# Patient Record
Sex: Female | Born: 1985 | Race: Black or African American | Hispanic: No | Marital: Single | Smoking: Never smoker
Health system: Southern US, Community
[De-identification: ages and names within clinical notes are randomized; demographics above are authoritative.]

---
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
## 2007-02-19 ENCOUNTER — Emergency Department: Payer: Self-pay | Admitting: Emergency Medicine

## 2008-04-19 ENCOUNTER — Emergency Department: Payer: Self-pay | Admitting: Emergency Medicine

## 2008-06-24 ENCOUNTER — Emergency Department: Payer: Self-pay | Admitting: Unknown Physician Specialty

## 2008-08-25 ENCOUNTER — Emergency Department: Payer: Self-pay | Admitting: Emergency Medicine

## 2008-08-27 ENCOUNTER — Emergency Department: Payer: Self-pay | Admitting: Emergency Medicine

## 2008-10-08 ENCOUNTER — Emergency Department: Payer: Self-pay | Admitting: Emergency Medicine

## 2008-11-18 ENCOUNTER — Observation Stay: Payer: Self-pay | Admitting: Obstetrics and Gynecology

## 2009-03-01 ENCOUNTER — Observation Stay: Payer: Self-pay | Admitting: Obstetrics & Gynecology

## 2009-04-06 ENCOUNTER — Inpatient Hospital Stay: Payer: Self-pay

## 2009-07-11 ENCOUNTER — Emergency Department: Payer: Self-pay | Admitting: Emergency Medicine

## 2010-07-14 ENCOUNTER — Ambulatory Visit: Payer: Self-pay | Admitting: Internal Medicine

## 2011-01-24 ENCOUNTER — Emergency Department: Payer: Self-pay | Admitting: Emergency Medicine

## 2011-07-25 ENCOUNTER — Observation Stay: Payer: Self-pay | Admitting: Obstetrics and Gynecology

## 2011-08-07 ENCOUNTER — Inpatient Hospital Stay: Payer: Self-pay | Admitting: Obstetrics and Gynecology

## 2011-08-07 LAB — CBC WITH DIFFERENTIAL/PLATELET
Basophil #: 0 10*3/uL (ref 0.0–0.1)
Basophil %: 0.2 %
MCH: 28.1 pg (ref 26.0–34.0)
MCHC: 31.9 g/dL — ABNORMAL LOW (ref 32.0–36.0)
Monocyte #: 1.1 x10 3/mm — ABNORMAL HIGH (ref 0.2–0.9)
Monocyte %: 6.8 %
Neutrophil #: 12.8 10*3/uL — ABNORMAL HIGH (ref 1.4–6.5)
Neutrophil %: 81.2 %
Platelet: 283 10*3/uL (ref 150–440)
RDW: 15.4 % — ABNORMAL HIGH (ref 11.5–14.5)

## 2011-08-08 LAB — HEMATOCRIT: HCT: 31.3 % — ABNORMAL LOW (ref 35.0–47.0)

## 2012-06-09 ENCOUNTER — Emergency Department: Payer: Self-pay | Admitting: Internal Medicine

## 2012-06-09 LAB — URINALYSIS, COMPLETE
Bilirubin,UR: NEGATIVE
Blood: NEGATIVE
Nitrite: NEGATIVE
Ph: 6 (ref 4.5–8.0)
RBC,UR: 3 /HPF (ref 0–5)
Specific Gravity: 1.029 (ref 1.003–1.030)
Squamous Epithelial: 12

## 2012-06-09 LAB — CBC
HCT: 37.5 % (ref 35.0–47.0)
HGB: 12.6 g/dL (ref 12.0–16.0)
MCHC: 33.7 g/dL (ref 32.0–36.0)
MCV: 94 fL (ref 80–100)
RBC: 3.97 10*6/uL (ref 3.80–5.20)
RDW: 13.5 % (ref 11.5–14.5)
WBC: 10.3 10*3/uL (ref 3.6–11.0)

## 2012-06-09 LAB — COMPREHENSIVE METABOLIC PANEL
Albumin: 4.1 g/dL (ref 3.4–5.0)
Alkaline Phosphatase: 70 U/L (ref 50–136)
BUN: 12 mg/dL (ref 7–18)
Bilirubin,Total: 0.3 mg/dL (ref 0.2–1.0)
Co2: 27 mmol/L (ref 21–32)
Creatinine: 0.75 mg/dL (ref 0.60–1.30)
EGFR (Non-African Amer.): >60
Osmolality: 276 (ref 275–301)
Potassium: 3.5 mmol/L (ref 3.5–5.1)
SGOT(AST): 27 U/L (ref 15–37)
Total Protein: 8 g/dL (ref 6.4–8.2)

## 2012-06-09 LAB — LIPASE, BLOOD: Lipase: 142 U/L (ref 73–393)

## 2012-08-15 ENCOUNTER — Other Ambulatory Visit: Payer: Self-pay | Admitting: Gastroenterology

## 2012-08-15 LAB — HCG, QUANTITATIVE, PREGNANCY: Beta Hcg, Quant.: <1 m[IU]/mL — ABNORMAL LOW

## 2012-08-18 ENCOUNTER — Ambulatory Visit: Payer: Self-pay | Admitting: Gastroenterology

## 2012-09-03 ENCOUNTER — Emergency Department: Payer: Self-pay | Admitting: Emergency Medicine

## 2013-08-13 IMAGING — NM NUCLEAR MEDICINE HEPATOHBILIARY INCLUDE GB
3 series · 20 of 20 positions shown · non-contrast
Comparison: none

REASON FOR EXAM: abd epigastric abnormal weight loss nausea w vomiting
change in bowel habits...
COMMENTS:

[Series 1000: gallbladder statics · 4.80mm/px · 8 of 8 slices shown]
[im 1/8]
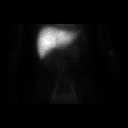
[im 2/8]
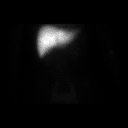
[im 3/8]
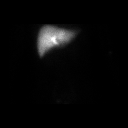
[im 4/8]
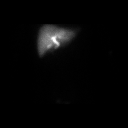
[im 5/8]
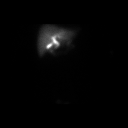
[im 6/8]
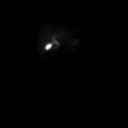
[im 7/8]
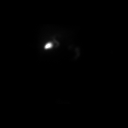
[im 8/8]
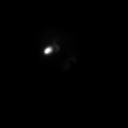

[Series 1000: gallbladder dynamic · 4.80mm/px · 6 of 60 frames shown]
[frame 6/60]
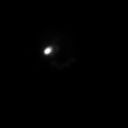
[frame 16/60]
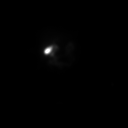
[frame 26/60]
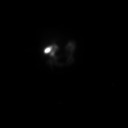
[frame 36/60]
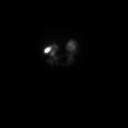
[frame 46/60]
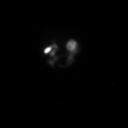
[frame 56/60]
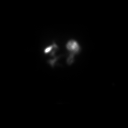

[Series 1000: gallbladder dynamic (results) · 4.80mm/px · 6 of 60 frames shown]
[frame 6/60]
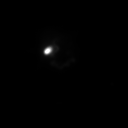
[frame 16/60]
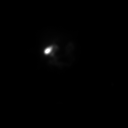
[frame 26/60]
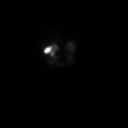
[frame 36/60]
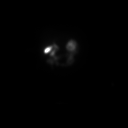
[frame 46/60]
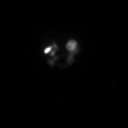
[frame 56/60]
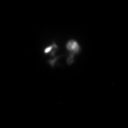

[20 of 20 positions shown; findings below may reference images not displayed]

PROCEDURE:     KNM - KNM HEPATO W/GB EJECT FRACTION  - August 18, 2012  [DATE]

RESULT:     Patient received an injection of 8.23 mCi of technetium 99 M
Choletec. Images demonstrate prompt extraction of tracer from the blood pool
by the liver with common bile duct activity seen by 10 minutes and early
gallbladder activity seen by 15 minutes. Small bowel activity is present at
20 minutes. There is continued clearing of activity from the liver
accumulation in the gallbladder throughout the study. The patient received a
continuous infusion of sincalide with a total dose of 1.46 mcg administered
intravenously over 30 minutes. The gallbladder ejection fraction is measured
at 65%.
IMPRESSION: Normal appearing hepatobiliary scan. Normal gallbladder
ejection fraction is 65% in response to sincalide infusion.

[REDACTED]

## 2014-04-13 NOTE — L&D Delivery Note (Signed)
Delivery Note At 1:52 AM a viable female was delivered via Vaginal, Spontaneous Delivery (Presentation: ;  ).  APGAR8/9: , ; weight  . Not done yet   Placenta status:intact  , .  Cord:delayed clamping   with the following complications None :  Anesthesia: Epidural  Episiotomy:  none Lacerations:  Second degree Suture Repair: 2.0 3.0 vicryl Est. Blood Loss (mL):    Mom to postpartum.  Baby to Couplet care / Skin to Skin.  Chelsea Wolfe 03/08/2015, 2:10 AM

## 2014-08-21 NOTE — H&P (Signed)
L&D Evaluation:  History:   HPI 29 yo G2P1001 at 3250w3d gestational age, pregnancy uncomplicated, presents for contractions.  Denies vaginal bleeding and leakage of fluid. Notes positive fetal movement.  O+, RI, RPR NR, HBsAg neg, VZI, GBS neg    Presents with contractions    Patient's Medical History No Chronic Illness    Patient's Surgical History none    Medications Pre Natal Vitamins    Allergies hydrocodone = rash    Social History tobacco    Family History Non-Contributory   ROS:   ROS negative unless noted in HPI   Exam:   Vital Signs stable    General having painful contractions    Mental Status clear    Chest clear    Heart normal sinus rhythm    Abdomen gravid, tender with contractions    Estimated Fetal Weight Average for gestational age    Fetal Position cephalic    Back no CVAT    Edema no edema    Pelvic 7cm per RN    Mebranes Intact    FHT normal rate with no decels    FHT Description 130/mod var/+accels/ no decels    Ucx irregular, 3-4 q 10 min    Skin no lesions   Impression:   Impression active labor   Plan:   Plan EFM/NST, monitor contractions and for cervical change, fluids    Comments CBC/T&S IVF, NPO CEFM/TOCO notify anesthesia ASAP. No IV analgesics as she may be progressing quickly.  If she slows down or stalls, may consider   Electronic Signatures: Conard NovakJackson, Oakley Kossman D (MD)  (Signed 26-Apr-13 18:06)  Authored: L&D Evaluation   Last Updated: 26-Apr-13 18:06 by Conard NovakJackson, Keiarah Orlowski D (MD)

## 2014-09-25 LAB — OB RESULTS CONSOLE HIV ANTIBODY (ROUTINE TESTING)
HIV: NONREACTIVE
HIV: NONREACTIVE

## 2014-09-25 LAB — OB RESULTS CONSOLE GC/CHLAMYDIA
Chlamydia: NEGATIVE
GC PROBE AMP, GENITAL: NEGATIVE

## 2014-09-25 LAB — OB RESULTS CONSOLE HEPATITIS B SURFACE ANTIGEN: Hepatitis B Surface Ag: NEGATIVE

## 2014-09-25 LAB — OB RESULTS CONSOLE RUBELLA ANTIBODY, IGM: Rubella: IMMUNE

## 2014-09-25 LAB — OB RESULTS CONSOLE RPR
RPR: NONREACTIVE
RPR: NONREACTIVE

## 2015-02-07 LAB — OB RESULTS CONSOLE GBS
STREP GROUP B AG: NEGATIVE
STREP GROUP B AG: NEGATIVE
STREP GROUP B AG: NEGATIVE
STREP GROUP B AG: NEGATIVE

## 2015-03-07 ENCOUNTER — Inpatient Hospital Stay: Payer: Self-pay | Admitting: Anesthesiology

## 2015-03-07 ENCOUNTER — Inpatient Hospital Stay
Admission: EM | Admit: 2015-03-07 | Discharge: 2015-03-09 | DRG: 775 | Disposition: A | Discharge status: Home / Self Care | Payer: Self-pay | Attending: Obstetrics and Gynecology | Admitting: Obstetrics and Gynecology

## 2015-03-07 ENCOUNTER — Encounter: Payer: Self-pay | Admitting: *Deleted

## 2015-03-07 DIAGNOSIS — Z3A39 39 weeks gestation of pregnancy: Secondary | ICD-10-CM

## 2015-03-07 LAB — CBC
HEMATOCRIT: 36.8 % (ref 35.0–47.0)
HEMOGLOBIN: 12.7 g/dL (ref 12.0–16.0)
MCH: 32.2 pg (ref 26.0–34.0)
MCHC: 34.4 g/dL (ref 32.0–36.0)
MCV: 93.7 fL (ref 80.0–100.0)
Platelets: 200 10*3/uL (ref 150–440)
RBC: 3.93 MIL/uL (ref 3.80–5.20)
RDW: 13.6 % (ref 11.5–14.5)
WBC: 11.4 10*3/uL — AB (ref 3.6–11.0)

## 2015-03-07 LAB — TYPE AND SCREEN
ABO/RH(D): O POS
ANTIBODY SCREEN: NEGATIVE

## 2015-03-07 LAB — ABO/RH: ABO/RH(D): O POS

## 2015-03-07 MED ORDER — NALOXONE HCL 0.4 MG/ML IJ SOLN: 0.4000 mg | INTRAMUSCULAR | Status: DC | PRN

## 2015-03-07 MED ORDER — LACTATED RINGERS IV SOLN
INTRAVENOUS | Status: DC
  Administered 2015-03-07: 1000 mL via INTRAVENOUS

## 2015-03-07 MED ORDER — LACTATED RINGERS IV SOLN
500.0000 mL | INTRAVENOUS | Status: DC | PRN
  Administered 2015-03-07: 500 mL via INTRAVENOUS

## 2015-03-07 MED ORDER — KETOROLAC TROMETHAMINE 30 MG/ML IJ SOLN: 30.0000 mg | Freq: Four times a day (QID) | INTRAMUSCULAR | Status: AC | PRN

## 2015-03-07 MED ORDER — SODIUM CHLORIDE 0.9 % IJ SOLN: 3.0000 mL | INTRAMUSCULAR | Status: DC | PRN

## 2015-03-07 MED ORDER — MEPERIDINE HCL 25 MG/ML IJ SOLN: 6.2500 mg | INTRAMUSCULAR | Status: DC | PRN

## 2015-03-07 MED ORDER — NALBUPHINE HCL 10 MG/ML IJ SOLN
5.0000 mg | Freq: Once | INTRAMUSCULAR | Status: DC | PRN
  Filled 2015-03-07: qty 0.5

## 2015-03-07 MED ORDER — CITRIC ACID-SODIUM CITRATE 334-500 MG/5ML PO SOLN: 30.0000 mL | ORAL | Status: DC | PRN

## 2015-03-07 MED ORDER — BUTORPHANOL TARTRATE 1 MG/ML IJ SOLN
INTRAMUSCULAR | Status: AC
  Administered 2015-03-07: 1 mg
  Filled 2015-03-07: qty 1

## 2015-03-07 MED ORDER — PENICILLIN G POTASSIUM 5000000 UNITS IJ SOLR
2.5000 10*6.[IU] | INTRAVENOUS | Status: DC
  Filled 2015-03-07 (×4): qty 2.5

## 2015-03-07 MED ORDER — SODIUM CHLORIDE 0.9 % IV SOLN
INTRAVENOUS | Status: AC | PRN
  Administered 2015-03-07 (×2): 5 mL via EPIDURAL

## 2015-03-07 MED ORDER — DIPHENHYDRAMINE HCL 25 MG PO CAPS: 25.0000 mg | ORAL_CAPSULE | ORAL | Status: DC | PRN

## 2015-03-07 MED ORDER — SODIUM CHLORIDE 0.9 % IJ SOLN
INTRAMUSCULAR | Status: AC
  Filled 2015-03-07: qty 3

## 2015-03-07 MED ORDER — NALBUPHINE HCL 10 MG/ML IJ SOLN
5.0000 mg | INTRAMUSCULAR | Status: DC | PRN
  Filled 2015-03-07: qty 0.5

## 2015-03-07 MED ORDER — ONDANSETRON HCL 4 MG/2ML IJ SOLN
4.0000 mg | Freq: Three times a day (TID) | INTRAMUSCULAR | Status: DC | PRN
Start: 2015-03-07 — End: 2015-03-09

## 2015-03-07 MED ORDER — ONDANSETRON HCL 4 MG/2ML IJ SOLN: 4.0000 mg | Freq: Four times a day (QID) | INTRAMUSCULAR | Status: DC | PRN

## 2015-03-07 MED ORDER — OXYTOCIN BOLUS FROM INFUSION: 500.0000 mL | INTRAVENOUS | Status: DC

## 2015-03-07 MED ORDER — LIDOCAINE-EPINEPHRINE (PF) 1.5 %-1:200000 IJ SOLN
INTRAMUSCULAR | Status: AC | PRN
  Administered 2015-03-07: 3 mL via PERINEURAL

## 2015-03-07 MED ORDER — NALOXONE HCL 2 MG/2ML IJ SOSY
1.0000 ug/kg/h | PREFILLED_SYRINGE | INTRAVENOUS | Status: DC | PRN
  Filled 2015-03-07: qty 2

## 2015-03-07 MED ORDER — LIDOCAINE HCL (PF) 1 % IJ SOLN
30.0000 mL | INTRAMUSCULAR | Status: DC | PRN
  Filled 2015-03-07: qty 30

## 2015-03-07 MED ORDER — SODIUM CHLORIDE 0.9 % IJ SOLN
INTRAMUSCULAR | Status: AC
  Filled 2015-03-07: qty 20

## 2015-03-07 MED ORDER — OXYTOCIN 40 UNITS IN LACTATED RINGERS INFUSION - SIMPLE MED
62.5000 mL/h | INTRAVENOUS | Status: DC
  Filled 2015-03-07: qty 1000

## 2015-03-07 MED ORDER — FENTANYL 2.5 MCG/ML W/ROPIVACAINE 0.2% IN NS 100 ML EPIDURAL INFUSION (ARMC-ANES): 10.0000 mL/h | EPIDURAL | Status: DC

## 2015-03-07 MED ORDER — ACETAMINOPHEN 325 MG PO TABS
650.0000 mg | ORAL_TABLET | ORAL | Status: DC | PRN
  Filled 2015-03-07 (×2): qty 2

## 2015-03-07 MED ORDER — FENTANYL 2.5 MCG/ML W/ROPIVACAINE 0.2% IN NS 100 ML EPIDURAL INFUSION (ARMC-ANES)
EPIDURAL | Status: AC
  Administered 2015-03-07: 10 mL/h via EPIDURAL
  Filled 2015-03-07: qty 100

## 2015-03-07 MED ORDER — PENICILLIN G POTASSIUM 5000000 UNITS IJ SOLR
5.0000 10*6.[IU] | Freq: Once | INTRAVENOUS | Status: DC
  Filled 2015-03-07: qty 5

## 2015-03-07 MED ORDER — DIPHENHYDRAMINE HCL 50 MG/ML IJ SOLN: 12.5000 mg | INTRAMUSCULAR | Status: DC | PRN

## 2015-03-07 MED ORDER — LIDOCAINE HCL (PF) 1 % IJ SOLN: INTRAMUSCULAR | Status: DC | PRN

## 2015-03-07 NOTE — Anesthesia Preprocedure Evaluation (Signed)
Anesthesia Evaluation  Patient identified by MRN, date of birth, ID band Patient awake    Reviewed: Allergy & Precautions, H&P , NPO status , Patient's Chart, lab work & pertinent test results, reviewed documented beta blocker date and time   History of Anesthesia Complications Negative for: history of anesthetic complications  Airway Mallampati: IV  TM Distance: >3 FB Neck ROM: full    Dental no notable dental hx. (+) Teeth Intact   Pulmonary neg pulmonary ROS,    Pulmonary exam normal breath sounds clear to auscultation       Cardiovascular Exercise Tolerance: Good negative cardio ROS Normal cardiovascular exam Rhythm:regular Rate:Normal     Neuro/Psych negative neurological ROS  negative psych ROS   GI/Hepatic negative GI ROS, Neg liver ROS,   Endo/Other  negative endocrine ROS  Renal/GU negative Renal ROS  negative genitourinary   Musculoskeletal   Abdominal   Peds  Hematology negative hematology ROS (+)   Anesthesia Other Findings No past medical history on file.   Reproductive/Obstetrics (+) Pregnancy                             Anesthesia Physical Anesthesia Plan  ASA: II  Anesthesia Plan: Epidural   Post-op Pain Management:    Induction:   Airway Management Planned:   Additional Equipment:   Intra-op Plan:   Post-operative Plan:   Informed Consent: I have reviewed the patients History and Physical, chart, labs and discussed the procedure including the risks, benefits and alternatives for the proposed anesthesia with the patient or authorized representative who has indicated his/her understanding and acceptance.   Dental Advisory Given  Plan Discussed with: Anesthesiologist, CRNA and Surgeon  Anesthesia Plan Comments:         Anesthesia Quick Evaluation

## 2015-03-07 NOTE — Anesthesia Procedure Notes (Signed)
Epidural Patient location during procedure: OB Start time: 03/07/2015 9:10 PM End time: 03/07/2015 9:20 PM  Staffing Anesthesiologist: Lenard SimmerKARENZ, Noriko Macari Performed by: anesthesiologist   Preanesthetic Checklist Completed: patient identified, site marked, surgical consent, pre-op evaluation, timeout performed, IV checked, risks and benefits discussed and monitors and equipment checked  Epidural Patient position: sitting Prep: ChloraPrep Patient monitoring: heart rate, continuous pulse ox and blood pressure Approach: midline Location: L3-L4 Injection technique: LOR saline  Needle:  Needle type: Tuohy  Needle gauge: 18 G Needle length: 9 cm and 9 Needle insertion depth: 6 cm Catheter type: closed end flexible Catheter size: 20 Guage Catheter at skin depth: 11 cm Test dose: negative and 1.5% lidocaine with Epi 1:200 K  Assessment Events: blood not aspirated, injection not painful, no injection resistance, negative IV test and no paresthesia  Additional Notes   Patient tolerated the insertion well without complications.Reason for block:procedure for pain

## 2015-03-07 NOTE — H&P (Signed)
Chelsea Wolfe is a 29 y.o. G3p2 at 39+5 weeks  female presenting for labor . 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 No recent outbreaks .  History OB History    Gravida Para Term Preterm AB TAB SAB Ectopic Multiple Living   3         1     No past medical history on file. PMHX : unremarkable  PSHX : negative  History reviewed. No pertinent past surgical history. Family History: family history is not on file. Social History:  reports that she has never smoked. She does not have any smokeless tobacco history on file. She reports that she does not drink alcohol or use illicit drugs.   Prenatal Transfer Tool  Maternal Diabetes: No Genetic Screening: no records of genetic screening  Maternal Ultrasounds/Referrals: Normal Fetal Ultrasounds or other Referrals:  None Maternal Substance Abuse:  No Significant Maternal Medications:  None Significant Maternal Lab Results:  None Other Comments:  None  ROS  Dilation: 5 Effacement (%): 80 Station: -2 Exam by:: gck+ TJS  Height 5\' 5"  (1.651 m), weight 200 lb (90.719 kg).  Lungs CTA  CV RRR  External genitalia : negative , no lesions  Exam Physical Exam  Prenatal labs: ABO, Rh:  O+ Antibody:  not recorded  Rubella:  Immune RPR:   neg HBsAg:   neg HIV:   neg GBS:   neg  reactive NST no decels . Regular CTX 2-3 minutes  Assessment/Plan: Labor , arom clear.  Anticipate SVD  1 mg stadol , pt request CLE   Rainn Zupko 03/07/2015, 8:18 PM

## 2015-03-08 ENCOUNTER — Encounter: Payer: Self-pay | Admitting: *Deleted

## 2015-03-08 LAB — CBC
HCT: 35.1 % (ref 35.0–47.0)
HEMOGLOBIN: 11.8 g/dL — AB (ref 12.0–16.0)
MCH: 31.6 pg (ref 26.0–34.0)
MCHC: 33.5 g/dL (ref 32.0–36.0)
MCV: 94.2 fL (ref 80.0–100.0)
PLATELETS: 197 10*3/uL (ref 150–440)
RBC: 3.73 MIL/uL — ABNORMAL LOW (ref 3.80–5.20)
RDW: 13.6 % (ref 11.5–14.5)
WBC: 16.6 10*3/uL — ABNORMAL HIGH (ref 3.6–11.0)

## 2015-03-08 MED ORDER — IBUPROFEN 600 MG PO TABS
600.0000 mg | ORAL_TABLET | Freq: Four times a day (QID) | ORAL | Status: DC
  Administered 2015-03-08 – 2015-03-09 (×6): 600 mg via ORAL
  Filled 2015-03-08 (×6): qty 1

## 2015-03-08 MED ORDER — OXYTOCIN 40 UNITS IN LACTATED RINGERS INFUSION - SIMPLE MED: 62.5000 mL/h | INTRAVENOUS | Status: DC | PRN

## 2015-03-08 MED ORDER — DIBUCAINE 1 % RE OINT: 1.0000 "application " | TOPICAL_OINTMENT | RECTAL | Status: DC | PRN

## 2015-03-08 MED ORDER — BENZOCAINE-MENTHOL 20-0.5 % EX AERO
1.0000 "application " | INHALATION_SPRAY | CUTANEOUS | Status: DC | PRN
  Administered 2015-03-08: 1 via TOPICAL
  Filled 2015-03-08: qty 56

## 2015-03-08 MED ORDER — ZOLPIDEM TARTRATE 5 MG PO TABS: 5.0000 mg | ORAL_TABLET | Freq: Every evening | ORAL | Status: DC | PRN

## 2015-03-08 MED ORDER — WITCH HAZEL-GLYCERIN EX PADS: 1.0000 "application " | MEDICATED_PAD | CUTANEOUS | Status: DC | PRN

## 2015-03-08 MED ORDER — LANOLIN HYDROUS EX OINT: TOPICAL_OINTMENT | CUTANEOUS | Status: DC | PRN

## 2015-03-08 MED ORDER — ACETAMINOPHEN 325 MG PO TABS
650.0000 mg | ORAL_TABLET | ORAL | Status: DC | PRN
  Administered 2015-03-08 (×2): 650 mg via ORAL

## 2015-03-08 MED ORDER — MEASLES, MUMPS & RUBELLA VAC ~~LOC~~ INJ
0.5000 mL | INJECTION | Freq: Once | SUBCUTANEOUS | Status: DC
  Filled 2015-03-08: qty 0.5

## 2015-03-08 MED ORDER — PRENATAL MULTIVITAMIN CH
1.0000 | ORAL_TABLET | Freq: Every day | ORAL | Status: DC
  Administered 2015-03-08 – 2015-03-09 (×2): 1 via ORAL
  Filled 2015-03-08 (×2): qty 1

## 2015-03-08 MED ORDER — MEPERIDINE HCL 50 MG PO TABS
50.0000 mg | ORAL_TABLET | ORAL | Status: DC | PRN
  Administered 2015-03-08 – 2015-03-09 (×5): 50 mg via ORAL
  Filled 2015-03-08 (×5): qty 1

## 2015-03-08 MED ORDER — SENNOSIDES-DOCUSATE SODIUM 8.6-50 MG PO TABS: 2.0000 | ORAL_TABLET | ORAL | Status: DC

## 2015-03-08 MED ORDER — ONDANSETRON HCL 4 MG/2ML IJ SOLN: 4.0000 mg | INTRAMUSCULAR | Status: DC | PRN

## 2015-03-08 MED ORDER — DIPHENHYDRAMINE HCL 25 MG PO CAPS: 25.0000 mg | ORAL_CAPSULE | Freq: Four times a day (QID) | ORAL | Status: DC | PRN

## 2015-03-08 MED ORDER — SIMETHICONE 80 MG PO CHEW: 80.0000 mg | CHEWABLE_TABLET | ORAL | Status: DC | PRN

## 2015-03-08 MED ORDER — FERROUS SULFATE 325 (65 FE) MG PO TABS
325.0000 mg | ORAL_TABLET | Freq: Two times a day (BID) | ORAL | Status: DC
  Administered 2015-03-08 – 2015-03-09 (×3): 325 mg via ORAL
  Filled 2015-03-08 (×3): qty 1

## 2015-03-08 MED ORDER — ONDANSETRON HCL 4 MG PO TABS: 4.0000 mg | ORAL_TABLET | ORAL | Status: DC | PRN

## 2015-03-08 MED ORDER — MAGNESIUM HYDROXIDE 400 MG/5ML PO SUSP: 30.0000 mL | ORAL | Status: DC | PRN

## 2015-03-09 LAB — RPR: RPR: NONREACTIVE

## 2015-03-09 MED ORDER — IBUPROFEN 600 MG PO TABS: 600.0000 mg | ORAL_TABLET | Freq: Four times a day (QID) | ORAL | Status: AC

## 2015-03-09 MED ORDER — MEPERIDINE HCL 50 MG PO TABS: 50.0000 mg | ORAL_TABLET | ORAL | Status: AC | PRN

## 2015-03-09 MED ORDER — BENZOCAINE-MENTHOL 20-0.5 % EX AERO: 1.0000 "application " | INHALATION_SPRAY | CUTANEOUS | Status: AC | PRN

## 2015-03-09 NOTE — Progress Notes (Signed)
D/C order from MD.  Reviewed d/c instructions and prescriptions with patient and answered any questions.  Patient d/c home with infant via wheelchair by nursing/auxillary. 

## 2015-03-09 NOTE — Discharge Summary (Signed)
Obstetric Discharge Summary Reason for Admission: onset of labor Prenatal Procedures: none Intrapartum Procedures: spontaneous vaginal delivery, second degree laceration + repair Postpartum Procedures: none Complications-Operative and Postpartum: none HEMOGLOBIN  Date Value Ref Range Status  03/08/2015 11.8* 12.0 - 16.0 g/dL Final   HGB  Date Value Ref Range Status  06/09/2012 12.6 12.0-16.0 g/dL Final   HCT  Date Value Ref Range Status  03/08/2015 35.1 35.0 - 47.0 % Final  06/09/2012 37.5 35.0-47.0 % Final    Physical Exam:  General: alert and cooperative Lochia: appropriate Uterine Fundus: firm Incision: healing well DVT Evaluation: No evidence of DVT seen on physical exam.  Discharge Diagnoses: Term Pregnancy-delivered  Discharge Information: Date: 03/09/2015 Activity: pelvic rest Diet: routine Medications: Ibuprofen and demerol Condition: stable Instructions: refer to practice specific booklet Discharge to: home Follow-up Information    Follow up In 6 weeks.   Why:  OB provider in [place]      Newborn Data: Live born female  Birth Weight: 9 lb 3.1 oz (4170 g) APGAR: 8, 9  Home with mother.  Chelsea Wolfe 03/09/2015, 9:44 AM

## 2015-03-09 NOTE — Discharge Instructions (Signed)
Please call your doctor or return to the ER if you experience any chest pains, shortness of breath, fever greater than 101, any heavy bleeding or large clots, and foul smelling vaginal discharge, any worsening abdominal pain & cramping that is not controlled by pain medication, or any signs of post partum depression.  No tampons, enemas, douches, or sexual intercourse for 6 weeks.  Also avoid tub baths, hot tubs, or swimming for 6 weeks. ° ° ° °Postpartum Care After Vaginal Delivery °After you deliver your newborn (postpartum period), the usual stay in the hospital is 24-72 hours. If there were problems with your labor or delivery, or if you have other medical problems, you might be in the hospital longer.  °While you are in the hospital, you will receive help and instructions on how to care for yourself and your newborn during the postpartum period.  °While you are in the hospital: °· Be sure to tell your nurses if you have pain or discomfort, as well as where you feel the pain and what makes the pain worse. °· If you had an incision made near your vagina (episiotomy) or if you had some tearing during delivery, the nurses may put ice packs on your episiotomy or tear. The ice packs may help to reduce the pain and swelling. °· If you are breastfeeding, you may feel uncomfortable contractions of your uterus for a couple of weeks. This is normal. The contractions help your uterus get back to normal size. °· It is normal to have some bleeding after delivery. °¨ For the first 1-3 days after delivery, the flow is red and the amount may be similar to a period. °¨ It is common for the flow to start and stop. °¨ In the first few days, you may pass some small clots. Let your nurses know if you begin to pass large clots or your flow increases. °¨ Do not  flush blood clots down the toilet before having the nurse look at them. °¨ During the next 3-10 days after delivery, your flow should become more watery and pink or  brown-tinged in color. °¨ Ten to fourteen days after delivery, your flow should be a small amount of yellowish-white discharge. °¨ The amount of your flow will decrease over the first few weeks after delivery. Your flow may stop in 6-8 weeks. Most women have had their flow stop by 12 weeks after delivery. °· You should change your sanitary pads frequently. °· Wash your hands thoroughly with soap and water for at least 20 seconds after changing pads, using the toilet, or before holding or feeding your newborn. °· You should feel like you need to empty your bladder within the first 6-8 hours after delivery. °· In case you become weak, lightheaded, or faint, call your nurse before you get out of bed for the first time and before you take a shower for the first time. °· Within the first few days after delivery, your breasts may begin to feel tender and full. This is called engorgement. Breast tenderness usually goes away within 48-72 hours after engorgement occurs. You may also notice milk leaking from your breasts. If you are not breastfeeding, do not stimulate your breasts. Breast stimulation can make your breasts produce more milk. °· Spending as much time as possible with your newborn is very important. During this time, you and your newborn can feel close and get to know each other. Having your newborn stay in your room (rooming in) will help to strengthen   the bond with your newborn.  It will give you time to get to know your newborn and become comfortable caring for your newborn. °· Your hormones change after delivery. Sometimes the hormone changes can temporarily cause you to feel sad or tearful. These feelings should not last more than a few days. If these feelings last longer than that, you should talk to your caregiver. °· If desired, talk to your caregiver about methods of family planning or contraception. °· Talk to your caregiver about immunizations. Your caregiver may want you to have the following  immunizations before leaving the hospital: °¨ Tetanus, diphtheria, and pertussis (Tdap) or tetanus and diphtheria (Td) immunization. It is very important that you and your family (including grandparents) or others caring for your newborn are up-to-date with the Tdap or Td immunizations. The Tdap or Td immunization can help protect your newborn from getting ill. °¨ Rubella immunization. °¨ Varicella (chickenpox) immunization. °¨ Influenza immunization. You should receive this annual immunization if you did not receive the immunization during your pregnancy. °  °This information is not intended to replace advice given to you by your health care provider. Make sure you discuss any questions you have with your health care provider. °  °Document Released: 01/25/2007 Document Revised: 12/23/2011 Document Reviewed: 11/25/2011 °[employer] Interactive Patient Education ©2016 [employer] ° °

## 2023-12-14 ENCOUNTER — Other Ambulatory Visit: Payer: Self-pay | Admitting: Medical Genetics
